# Patient Record
Sex: Female | Born: 1985 | Race: White | Hispanic: No | Marital: Married | Smoking: Never smoker
Health system: Southern US, Community
[De-identification: ages and names within clinical notes are randomized; demographics above are authoritative.]

## PROBLEM LIST (undated history)

## (undated) DIAGNOSIS — Z8619 Personal history of other infectious and parasitic diseases: Secondary | ICD-10-CM

## (undated) DIAGNOSIS — Z789 Other specified health status: Secondary | ICD-10-CM

## (undated) HISTORY — PX: WISDOM TOOTH EXTRACTION: SHX21

## (undated) HISTORY — DX: Personal history of other infectious and parasitic diseases: Z86.19

---
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
## 2009-01-19 ENCOUNTER — Encounter: Admission: RE | Admit: 2009-01-19 | Discharge: 2009-01-19 | Payer: Self-pay | Admitting: Certified Nurse Midwife

## 2014-07-14 NOTE — L&D Delivery Note (Signed)
Delivery Note - CNM delivery for Dr Seymour Bars  First Stage: Labor induction with labor onset @ [phone] with ROM and pitocin Analgesia /Anesthesia intrapartum: epidural AROM at 0744 - clear  Second Stage: Complete dilation at 1442 Onset of pushing at 1500 FHR second stage category 1  Delivery of a viable female at 15276 by CNM in LOA position no nuchal cord Cord double clamped after cessation of pulsation, cut by FOB  Collection of cord blood donation - placenta to cord blood staff for collections  Third Stage: Placenta delivered [facility] intact with 3 VC @ 1537 Placenta disposition: hospital disposal Uterine tone firm / bleeding small  2nd degree laceration identified  Anesthesia for repair: epidural and 1%xylociane local Repair 3-0 vicryl vaginal and deep perineal muscle interrupted / 4-0 vicryl subcuticular Est. Blood Loss (mL): 150  Complications: none  Mom to postpartum.  Baby to Couplet care / Skin to Skin.  Newborn: Birth Weight: 7lb -14.6oz Apgar Scores: 8-9 Feeding planned: breast  Marlinda Mike CNM, MSN, FACNM 03/08/2015, 4:32 PM

## 2014-07-30 LAB — OB RESULTS CONSOLE ANTIBODY SCREEN: Antibody Screen: NEGATIVE

## 2014-07-30 LAB — OB RESULTS CONSOLE HIV ANTIBODY (ROUTINE TESTING): HIV: NONREACTIVE

## 2014-07-30 LAB — OB RESULTS CONSOLE HEPATITIS B SURFACE ANTIGEN: Hepatitis B Surface Ag: NEGATIVE

## 2014-07-30 LAB — OB RESULTS CONSOLE RPR: RPR: NONREACTIVE

## 2014-07-30 LAB — OB RESULTS CONSOLE ABO/RH: RH Type: POSITIVE

## 2014-07-30 LAB — OB RESULTS CONSOLE RUBELLA ANTIBODY, IGM: Rubella: IMMUNE

## 2014-08-07 LAB — OB RESULTS CONSOLE GC/CHLAMYDIA
CHLAMYDIA, DNA PROBE: NEGATIVE
GC PROBE AMP, GENITAL: NEGATIVE

## 2015-02-06 LAB — OB RESULTS CONSOLE GBS: GBS: NEGATIVE

## 2015-02-27 ENCOUNTER — Telehealth (HOSPITAL_COMMUNITY): Payer: Self-pay | Admitting: *Deleted

## 2015-02-27 ENCOUNTER — Encounter (HOSPITAL_COMMUNITY): Payer: Self-pay | Admitting: *Deleted

## 2015-02-27 NOTE — Telephone Encounter (Signed)
Preadmission screen  

## 2015-03-02 ENCOUNTER — Encounter (HOSPITAL_COMMUNITY): Payer: Self-pay | Admitting: *Deleted

## 2015-03-02 ENCOUNTER — Telehealth (HOSPITAL_COMMUNITY): Payer: Self-pay | Admitting: *Deleted

## 2015-03-02 NOTE — Telephone Encounter (Signed)
Preadmission screen  

## 2015-03-04 ENCOUNTER — Inpatient Hospital Stay (HOSPITAL_COMMUNITY): Admission: AD | Admit: 2015-03-04 | Payer: Self-pay | Source: Ambulatory Visit | Admitting: Obstetrics & Gynecology

## 2015-03-05 ENCOUNTER — Other Ambulatory Visit: Payer: Self-pay | Admitting: Obstetrics & Gynecology

## 2015-03-07 ENCOUNTER — Inpatient Hospital Stay (HOSPITAL_COMMUNITY)
Admission: RE | Admit: 2015-03-07 | Discharge: 2015-03-10 | DRG: 775 | Disposition: A | Discharge status: Home / Self Care | Payer: BC Managed Care – PPO | Source: Ambulatory Visit | Attending: Obstetrics & Gynecology | Admitting: Obstetrics & Gynecology

## 2015-03-07 ENCOUNTER — Encounter (HOSPITAL_COMMUNITY): Payer: Self-pay

## 2015-03-07 DIAGNOSIS — O480 Post-term pregnancy: Principal | ICD-10-CM | POA: Diagnosis present

## 2015-03-07 DIAGNOSIS — Z3A40 40 weeks gestation of pregnancy: Secondary | ICD-10-CM | POA: Diagnosis present

## 2015-03-07 DIAGNOSIS — Z8249 Family history of ischemic heart disease and other diseases of the circulatory system: Secondary | ICD-10-CM | POA: Diagnosis not present

## 2015-03-07 DIAGNOSIS — Z9889 Other specified postprocedural states: Secondary | ICD-10-CM | POA: Diagnosis present

## 2015-03-07 HISTORY — DX: Other specified health status: Z78.9

## 2015-03-07 LAB — CBC
HEMATOCRIT: 37.2 % (ref 36.0–46.0)
HEMOGLOBIN: 12 g/dL (ref 12.0–15.0)
MCH: 27.8 pg (ref 26.0–34.0)
MCHC: 32.3 g/dL (ref 30.0–36.0)
MCV: 86.3 fL (ref 78.0–100.0)
Platelets: 234 10*3/uL (ref 150–400)
RBC: 4.31 MIL/uL (ref 3.87–5.11)
RDW: 15.2 % (ref 11.5–15.5)
WBC: 9.4 10*3/uL (ref 4.0–10.5)

## 2015-03-07 LAB — TYPE AND SCREEN
ABO/RH(D): A POS
Antibody Screen: NEGATIVE

## 2015-03-07 MED ORDER — OXYCODONE-ACETAMINOPHEN 5-325 MG PO TABS: 1.0000 | ORAL_TABLET | ORAL | Status: DC | PRN

## 2015-03-07 MED ORDER — FLEET ENEMA 7-19 GM/118ML RE ENEM: 1.0000 | ENEMA | RECTAL | Status: DC | PRN

## 2015-03-07 MED ORDER — DIPHENHYDRAMINE HCL 50 MG/ML IJ SOLN: 12.5000 mg | INTRAMUSCULAR | Status: DC | PRN

## 2015-03-07 MED ORDER — OXYTOCIN 40 UNITS IN LACTATED RINGERS INFUSION - SIMPLE MED
1.0000 m[IU]/min | INTRAVENOUS | Status: DC
  Administered 2015-03-07: 2 m[IU]/min via INTRAVENOUS
  Filled 2015-03-07: qty 1000

## 2015-03-07 MED ORDER — OXYCODONE-ACETAMINOPHEN 5-325 MG PO TABS: 2.0000 | ORAL_TABLET | ORAL | Status: DC | PRN

## 2015-03-07 MED ORDER — PHENYLEPHRINE 40 MCG/ML (10ML) SYRINGE FOR IV PUSH (FOR BLOOD PRESSURE SUPPORT)
80.0000 ug | PREFILLED_SYRINGE | INTRAVENOUS | Status: DC | PRN
  Filled 2015-03-07: qty 20
  Filled 2015-03-07: qty 2

## 2015-03-07 MED ORDER — TERBUTALINE SULFATE 1 MG/ML IJ SOLN
0.2500 mg | Freq: Once | INTRAMUSCULAR | Status: DC | PRN
  Filled 2015-03-07: qty 1

## 2015-03-07 MED ORDER — OXYTOCIN 40 UNITS IN LACTATED RINGERS INFUSION - SIMPLE MED
62.5000 mL/h | INTRAVENOUS | Status: DC
  Administered 2015-03-08: 62.5 mL/h via INTRAVENOUS

## 2015-03-07 MED ORDER — LACTATED RINGERS IV SOLN
INTRAVENOUS | Status: DC
  Administered 2015-03-07 – 2015-03-08 (×2): 125 mL/h via INTRAVENOUS

## 2015-03-07 MED ORDER — LIDOCAINE HCL (PF) 1 % IJ SOLN
30.0000 mL | INTRAMUSCULAR | Status: AC | PRN
  Administered 2015-03-08: 30 mL via SUBCUTANEOUS
  Filled 2015-03-07: qty 30

## 2015-03-07 MED ORDER — OXYTOCIN BOLUS FROM INFUSION: 500.0000 mL | INTRAVENOUS | Status: DC

## 2015-03-07 MED ORDER — FENTANYL 2.5 MCG/ML BUPIVACAINE 1/10 % EPIDURAL INFUSION (WH - ANES)
14.0000 mL/h | INTRAMUSCULAR | Status: DC | PRN
  Administered 2015-03-08: 14 mL/h via EPIDURAL
  Filled 2015-03-07: qty 125

## 2015-03-07 MED ORDER — ACETAMINOPHEN 325 MG PO TABS: 650.0000 mg | ORAL_TABLET | ORAL | Status: DC | PRN

## 2015-03-07 MED ORDER — EPHEDRINE 5 MG/ML INJ
10.0000 mg | INTRAVENOUS | Status: DC | PRN
  Filled 2015-03-07: qty 2

## 2015-03-07 MED ORDER — CITRIC ACID-SODIUM CITRATE 334-500 MG/5ML PO SOLN: 30.0000 mL | ORAL | Status: DC | PRN

## 2015-03-07 MED ORDER — LACTATED RINGERS IV SOLN: 500.0000 mL | INTRAVENOUS | Status: DC | PRN

## 2015-03-07 MED ORDER — ONDANSETRON HCL 4 MG/2ML IJ SOLN: 4.0000 mg | Freq: Four times a day (QID) | INTRAMUSCULAR | Status: DC | PRN

## 2015-03-08 ENCOUNTER — Inpatient Hospital Stay (HOSPITAL_COMMUNITY): Payer: BC Managed Care – PPO | Admitting: Anesthesiology

## 2015-03-08 ENCOUNTER — Encounter (HOSPITAL_COMMUNITY): Payer: Self-pay

## 2015-03-08 LAB — SYPHILIS: RPR W/REFLEX TO RPR TITER AND TREPONEMAL ANTIBODIES, TRADITIONAL SCREENING AND DIAGNOSIS ALGORITHM: RPR Ser Ql: NONREACTIVE

## 2015-03-08 LAB — ABO/RH: ABO/RH(D): A POS

## 2015-03-08 MED ORDER — SENNOSIDES-DOCUSATE SODIUM 8.6-50 MG PO TABS
2.0000 | ORAL_TABLET | ORAL | Status: DC
  Administered 2015-03-09 – 2015-03-10 (×2): 2 via ORAL
  Filled 2015-03-08 (×3): qty 2

## 2015-03-08 MED ORDER — BENZOCAINE-MENTHOL 20-0.5 % EX AERO
1.0000 "application " | INHALATION_SPRAY | CUTANEOUS | Status: DC | PRN
  Administered 2015-03-08: 1 via TOPICAL
  Filled 2015-03-08: qty 56

## 2015-03-08 MED ORDER — LIDOCAINE HCL (PF) 1 % IJ SOLN
INTRAMUSCULAR | Status: DC | PRN
  Administered 2015-03-08 (×2): 4 mL via EPIDURAL

## 2015-03-08 MED ORDER — ACETAMINOPHEN 325 MG PO TABS
650.0000 mg | ORAL_TABLET | ORAL | Status: DC | PRN
  Administered 2015-03-09: 650 mg via ORAL
  Filled 2015-03-08: qty 2

## 2015-03-08 MED ORDER — DIBUCAINE 1 % RE OINT: 1.0000 "application " | TOPICAL_OINTMENT | RECTAL | Status: DC | PRN

## 2015-03-08 MED ORDER — SIMETHICONE 80 MG PO CHEW: 80.0000 mg | CHEWABLE_TABLET | ORAL | Status: DC | PRN

## 2015-03-08 MED ORDER — TRAMADOL HCL 50 MG PO TABS
50.0000 mg | ORAL_TABLET | Freq: Four times a day (QID) | ORAL | Status: DC | PRN
  Administered 2015-03-08 – 2015-03-10 (×2): 50 mg via ORAL
  Filled 2015-03-08 (×2): qty 1

## 2015-03-08 MED ORDER — IBUPROFEN 800 MG PO TABS
800.0000 mg | ORAL_TABLET | Freq: Once | ORAL | Status: AC
  Administered 2015-03-08: 800 mg via ORAL
  Filled 2015-03-08: qty 1

## 2015-03-08 MED ORDER — LACTATED RINGERS IV SOLN
INTRAVENOUS | Status: DC
  Administered 2015-03-08: 10:00:00 via INTRAUTERINE

## 2015-03-08 MED ORDER — IBUPROFEN 800 MG PO TABS
800.0000 mg | ORAL_TABLET | Freq: Three times a day (TID) | ORAL | Status: DC
  Administered 2015-03-09 – 2015-03-10 (×5): 800 mg via ORAL
  Filled 2015-03-08 (×5): qty 1

## 2015-03-08 MED ORDER — WITCH HAZEL-GLYCERIN EX PADS: 1.0000 "application " | MEDICATED_PAD | CUTANEOUS | Status: DC | PRN

## 2015-03-08 MED ORDER — LANOLIN HYDROUS EX OINT: TOPICAL_OINTMENT | CUTANEOUS | Status: DC | PRN

## 2015-03-08 NOTE — Anesthesia Procedure Notes (Signed)
Epidural Patient location during procedure: OB  Staffing Anesthesiologist: Oumar Marcott Performed by: anesthesiologist   Preanesthetic Checklist Completed: patient identified, site marked, surgical consent, pre-op evaluation, timeout performed, IV checked, risks and benefits discussed, monitors and equipment checked and post-op pain management  Epidural Patient position: sitting Prep: site prepped and draped and DuraPrep Patient monitoring: continuous pulse ox and blood pressure Approach: midline Location: L3-L4 Injection technique: LOR saline  Needle:  Needle type: Tuohy  Needle gauge: 17 G Needle length: 9 cm and 9 Needle insertion depth: 7 cm Catheter type: closed end flexible Catheter size: 19 Gauge Catheter at skin depth: 12 cm Test dose: negative  Assessment Events: blood not aspirated, injection not painful, no injection resistance, negative IV test and no paresthesia  Additional Notes Patient identified. Risks/Benefits/Options discussed with patient including but not limited to bleeding, infection, nerve damage, paralysis, failed block, incomplete pain control, headache, blood pressure changes, nausea, vomiting, reactions to medications, itching and postpartum back pain. Confirmed with bedside nurse the patient's most recent platelet count. Confirmed with patient that they are not currently taking any anticoagulation, have any bleeding history or any family history of bleeding disorders. Patient expressed understanding and wished to proceed. All questions were answered. Sterile technique was used throughout the entire procedure. Please see nursing notes for vital signs. Test dose was given through epidural catheter and negative prior to continuing to dose epidural or start infusion. Warning signs of high block given to the patient including shortness of breath, tingling/numbness in hands, complete motor block, or any concerning symptoms with instructions to call for help.  Patient was given instructions on fall risk and not to get out of bed. All questions and concerns addressed with instructions to call with any issues or inadequate analgesia.  Reason for block:post-op pain management

## 2015-03-08 NOTE — Anesthesia Preprocedure Evaluation (Signed)
Anesthesia Evaluation  Patient identified by MRN, date of birth, ID band Patient awake    Reviewed: Allergy & Precautions, H&P , NPO status , Patient's Chart, lab work & pertinent test results  Airway Mallampati: II  TM Distance: >3 FB Neck ROM: full    Dental no notable dental hx.    Pulmonary neg pulmonary ROS,  breath sounds clear to auscultation  Pulmonary exam normal       Cardiovascular negative cardio ROS Normal cardiovascular examRhythm:regular Rate:Normal     Neuro/Psych negative neurological ROS  negative psych ROS   GI/Hepatic negative GI ROS, Neg liver ROS,   Endo/Other  negative endocrine ROS  Renal/GU negative Renal ROS  negative genitourinary   Musculoskeletal   Abdominal   Peds  Hematology negative hematology ROS (+)   Anesthesia Other Findings Pregnancy - uncomplicated Platelets and allergies reviewed Denies active cardiac or pulmonary symptoms, METS > 4  Denies blood thinning medications, bleeding disorders, hypertension, asthma, supine hypotension syndrome, previous anesthesia difficulties    Reproductive/Obstetrics (+) Pregnancy                             Anesthesia Physical Anesthesia Plan  ASA: II  Anesthesia Plan: Spinal   Post-op Pain Management:    Induction:   Airway Management Planned:   Additional Equipment:   Intra-op Plan:   Post-operative Plan:   Informed Consent: I have reviewed the patients History and Physical, chart, labs and discussed the procedure including the risks, benefits and alternatives for the proposed anesthesia with the patient or authorized representative who has indicated his/her understanding and acceptance.     Plan Discussed with:   Anesthesia Plan Comments:         Anesthesia Quick Evaluation

## 2015-03-08 NOTE — H&P (Signed)
Latoya Johnson is a 29 y.o. female G2P0010 [redacted]w[redacted]d presenting for Induction of labor.  OB History    Gravida Para Term Preterm AB TAB SAB Ectopic Multiple Living   2 0   1 1         Past Medical History  Diagnosis Date  . Hx of varicella   . Medical history non-contributory    Past Surgical History  Procedure Laterality Date  . Wisdom tooth extraction     Family History: family history includes Hypertension in her mother. Social History:  reports that she has never smoked. She has never used smokeless tobacco. She reports that she does not drink alcohol or use illicit drugs.  Allergies  Allergen Reactions  . Codeine Nausea And Vomiting    Dilation: 3 Effacement (%): 90 Station: -2 Exam by:: Latoya Johnson  AROM clear AF  Blood pressure 144/77, pulse 91, temperature 98.2 F (36.8 C), temperature source Oral, resp. rate 18, height  (1.702 m), weight 245 lb (111.131 kg), last menstrual period 05/28/2014, SpO2 99 %. Exam Physical Exam   FHR Base line 120's with good variability and accelerations.  No deceleration.  Cat. 1.  HPP:  Patient Active Problem List   Diagnosis Date Noted  . Status post induction of labor 03/07/2015    Prenatal labs: ABO, Rh: --/--/A POS, A POS (08/24 2220) Antibody: NEG (08/24 2220) Rubella:   Immune RPR: Nonreactive (01/17 0000)  HBsAg: Negative (01/17 0000)  HIV: Non-reactive (01/17 0000)  Genetic testing: Ultrascreen neg, AFP1 neg [place] anato: wnl, Placenta normal 1 hr GTT: 126 wnl GBS: Negative (07/26 0000)  [place] last visit EFW 65%, AFI wnl  Assessment/Plan: 40 4/7 wks for Post date induction.  FHR Cat 1.  Favorable cervix.  Early labor on Pitocin.  AROM just done. Expectant management towards probable vaginal delivery.  Epidural PRN.   Latoya Johnson,Latoya Johnson 03/08/2015, 8:01 AM

## 2015-03-08 NOTE — Progress Notes (Signed)
Subjective: Doing well, pain controled, UCs q3 min  Anesthesia epidural   Objective: BP 157/96 mmHg  Pulse 96  Temp(Src) 98.2 F (36.8 C) (Oral)  Resp 18  Ht  (1.702 m)  Wt 245 lb (111.131 kg)  BMI 38.36 kg/m2  SpO2 99%  LMP 05/28/2014   FHT:  FHR: 120's bpm, variability: moderate,  accelerations:  Present,  decelerations:  Absent UC:   regular, every 3 minutes VE:   7 cm/100/Vtx/Rt post occiput/0 station          IUPC in place with AmnioInfusion   Assessment / Plan: Induction of labor due to postterm,  progressing well on pitocin  FHR monitoring reassuring with AmnioInfusion.  Fetal Wellbeing:  Category I Pain Control:  Epidural  Anticipated MOD:  NSVD  Latoya Johnson,[name] 03/08/2015, 1:06 PM

## 2015-03-08 NOTE — Progress Notes (Signed)
Subjective: Doing well, pain controled, UCs q2-3 min  Anesthesia epidural   Objective: BP 130/69 mmHg  Pulse 79  Temp(Src) 98.2 F (36.8 C) (Oral)  Resp 18  Ht  (1.702 m)  Wt 245 lb (111.131 kg)  BMI 38.36 kg/m2  SpO2 99%  LMP 05/28/2014   FHT:  FHR: 120's bpm, variability: moderate,  accelerations:  Present,  decelerations:  Present Rare mild variable decelerations UC:   regular, every 2-3 minutes, but not well picked up with external monitor VE:   Dilation: 4 Effacement (%): 90 Station: -2 Exam by:: Esaias Cleavenger  IUPC put in place without difficulty.   Assessment / Plan: Induction of labor due to postterm,  progressing well on pitocin  Monitoring UCs with IUPC.  Fetal Wellbeing:  Category I Pain Control:  Epidural  Anticipated MOD:  NSVD  Jamaurion Slemmer,[name] 03/08/2015, 9:42 AM

## 2015-03-09 LAB — CBC
HCT: 32.5 % — ABNORMAL LOW (ref 36.0–46.0)
Hemoglobin: 10.6 g/dL — ABNORMAL LOW (ref 12.0–15.0)
MCH: 28.3 pg (ref 26.0–34.0)
MCHC: 32.6 g/dL (ref 30.0–36.0)
MCV: 86.9 fL (ref 78.0–100.0)
Platelets: 196 10*3/uL (ref 150–400)
RBC: 3.74 MIL/uL — ABNORMAL LOW (ref 3.87–5.11)
RDW: 15.3 % (ref 11.5–15.5)
WBC: 12.2 10*3/uL — ABNORMAL HIGH (ref 4.0–10.5)

## 2015-03-09 NOTE — Progress Notes (Signed)
PPD #1- SVD  Subjective:   Reports feeling well Tolerating po/ No nausea or vomiting Bleeding is light Pain controlled with Motrin Up ad lib / ambulatory / voiding without problems Newborn: breastfeeding  / Circumcision: planning   Objective:   VS:  VS:  Filed Vitals:   03/08/15 1747 03/08/15 1851 03/08/15 2155 03/09/15 0348  BP: 132/64 136/77 130/68 125/71  Pulse: 108 112 92 78  Temp: 99.2 F (37.3 C) 99.5 F (37.5 C) 99 F (37.2 C) 98.2 F (36.8 C)  TempSrc: Oral Oral Oral Oral  Resp: Height:      Weight:      SpO2: 98% 99% 98%     LABS:  Recent Labs  03/07/15 2220 03/09/15 0533  WBC 9.4 12.2*  HGB 12.0 10.6*  PLT 234 196   Blood type: --/--/A POS, A POS (08/24 2220) Rubella: Immune (01/17 0000)   I&O: Intake/Output    None     Physical Exam: Alert and oriented x3 Abdomen: soft, non-tender, non-distended  Fundus: firm, non-tender, U-2 Perineum: Well approximated, no significant erythema, edema, or drainage; healing well. Lochia: small Extremities: No edema, no calf pain or tenderness    Assessment:  PPD #1 G2P1011/ S/P:induced vaginal, 2nd degree laceration  Doing well    Plan: Continue routine post partum orders Anticipate D/C home tomorrow   Latoya Johnson, N MSN, CNM 03/09/2015, 5:42 PM

## 2015-03-09 NOTE — Lactation Note (Signed)
This note was copied from the chart of Latoya Johnson. Lactation Consultation Note  Patient Name: Latoya Johnson [id number] Date: 03/09/2015 Reason for consult: Initial assessment (encouraged mom to cal for latch assessent )  24 hour old , 1 % weight loss, Breast feeding up to 6O mins. Average time for feeding - 15 -30 mins. Latch Score- 7-9. 3 voids, 6 mec. Per mom recently fed for 30 mins at 130 p and presently baby sound asleep. LC recommended mom to call on nurses light with feeding cues.  Mother informed of post-discharge support and given phone number to the lactation department, including services  for phone call assistance; out-patient appointments; and breastfeeding support group. List of other breastfeeding resources  in the community given in the handout. Encouraged mother to call for problems or concerns related to breastfeeding.    Maternal Data Does the patient have breastfeeding experience prior to this delivery?: No  Feeding Feeding Type:  (per mom last fed around 130p ) Length of feed: 30 min  LATCH Score/Interventions                      Lactation Tools Discussed/Used     Consult Status Consult Status: Follow-up Date: 03/09/15 Follow-up type: In-patient    Kathrin Greathouse 03/09/2015, 3:30 PM

## 2015-03-09 NOTE — Anesthesia Postprocedure Evaluation (Signed)
Anesthesia Post Note  Patient: Latoya Johnson  Procedure(s) Performed: * No procedures listed *  Anesthesia type: Epidural  Patient location: Mother/Baby  Post pain: Pain level controlled  Post assessment: Post-op Vital signs reviewed  Last Vitals:  Filed Vitals:   03/09/15 0348  BP: 125/71  Pulse: 78  Temp: 36.8 C  Resp: 18    Post vital signs: Reviewed  Level of consciousness: awake  Complications: No apparent anesthesia complications

## 2015-03-09 NOTE — Lactation Note (Signed)
This note was copied from the chart of Latoya Kennethia Lynes. Lactation Consultation Note  Patient Name: Latoya Johnson [id number] Date: 03/09/2015 Reason for consult: Follow-up assessment Mom called for latch check. Baby demonstrating a good rhythmic suck with swallows noted. Basic teaching reviewed. Cluster feeding discussed. Encouraged to call for assist as needed.   Maternal Data Does the patient have breastfeeding experience prior to this delivery?: No  Feeding Feeding Type: Breast Fed Length of feed: 30 min  LATCH Score/Interventions Latch: Grasps breast easily, tongue down, lips flanged, rhythmical sucking.  Audible Swallowing: A few with stimulation Intervention(s): Skin to skin  Type of Nipple: Everted at rest and after stimulation  Comfort (Breast/Nipple): Soft / non-tender     Hold (Positioning): No assistance needed to correctly position infant at breast. Intervention(s): Skin to skin  LATCH Score: 9  Lactation Tools Discussed/Used     Consult Status Consult Status: Follow-up Date: 03/10/15 Follow-up type: In-patient    Alfred Levins 03/09/2015, 4:55 PM

## 2015-03-10 MED ORDER — IBUPROFEN 800 MG PO TABS: 800.0000 mg | ORAL_TABLET | Freq: Three times a day (TID) | ORAL | Status: AC

## 2015-03-10 NOTE — Discharge Summary (Signed)
Obstetric Discharge Summary Reason for Admission: induction of labor Prenatal Procedures: ultrasound Intrapartum Procedures: spontaneous vaginal delivery Postpartum Procedures: none Complications-Operative and Postpartum: 2nd degree perineal laceration HEMOGLOBIN  Date Value Ref Range Status  03/09/2015 10.6* 12.0 - 15.0 g/dL Final   HCT  Date Value Ref Range Status  03/09/2015 32.5* 36.0 - 46.0 % Final    Physical Exam:  General: alert, cooperative and no distress Lochia: appropriate Uterine Fundus: firm Perineum: 2nd degree repair healing well, no significant drainage, no dehiscence, no significant erythema DVT Evaluation: No evidence of DVT seen on physical exam. Negative Homan's sign. No cords or calf tenderness. No significant calf/ankle edema.  Discharge Diagnoses: Term Pregnancy-delivered  Discharge Information: Date: 03/10/2015 Activity: pelvic rest Diet: routine Medications: PNV and Ibuprofen Condition: stable Instructions: refer to practice specific booklet Discharge to: home Follow-up Information    Follow up with LAVOIE,[name], MD. Schedule an appointment as soon as possible for a visit in 6 weeks.   Specialty:  Obstetrics and Gynecology   Why:  postpartum visit   Contact information:   [address] [place] [address] [phone]       Newborn Data: Live born female on 03/08/2015 Birth Weight: 7 lb 14.6 oz (3589 g) APGAR: 8, 9  Home with mother.  Latoya Johnson, M MSN, CNM 03/10/2015, 11:19 AM

## 2015-03-10 NOTE — Discharge Instructions (Signed)

## 2015-03-10 NOTE — Progress Notes (Addendum)
PPD #2 - SVD  Subjective:   Reports feeling well Tolerating po/ No nausea or vomiting Bleeding is light Pain controlled with Motrin Up ad lib / ambulatory / voiding without problems Newborn: breastfeeding  / Circumcision: planning   Objective:   VS:  VS:  Filed Vitals:   03/08/15 2155 03/09/15 0348 03/09/15 1819 03/10/15 0545  BP: 130/68 125/71 115/67 126/76  Pulse: 92 78 84 69  Temp: 99 F (37.2 C) 98.2 F (36.8 C) 98.2 F (36.8 C) 97.7 F (36.5 C)  TempSrc: Oral Oral Oral Oral  Resp: Height:      Weight:      SpO2: 98%       LABS:   Recent Labs  03/07/15 2220 03/09/15 0533  WBC 9.4 12.2*  HGB 12.0 10.6*  PLT 234 196   Blood type: A POS (08/24 2220) Rubella: Immune (01/17 0000)   I&O: Intake/Output    None     Physical Exam: Alert and oriented x3 Abdomen: soft, non-tender, non-distended  Fundus: firm, non-tender, U-2 Perineum: Well approximated, no significant erythema, edema, or drainage; healing well. Lochia: small Extremities: No edema, no calf pain or tenderness    Assessment:  PPD #2 / [id number] / S/P:induced vaginal, 2nd degree laceration  Doing well    Plan: Continue routine post partum orders D/C home today   Latoya Johnson, M MSN, CNM 03/10/2015, 8:00 AM

## 2018-03-25 ENCOUNTER — Other Ambulatory Visit (HOSPITAL_COMMUNITY)
Admission: RE | Admit: 2018-03-25 | Discharge: 2018-03-25 | Disposition: A | Discharge status: Home / Self Care | Payer: BC Managed Care – PPO | Source: Ambulatory Visit | Attending: Obstetrics and Gynecology | Admitting: Obstetrics and Gynecology

## 2018-03-25 ENCOUNTER — Other Ambulatory Visit: Payer: Self-pay | Admitting: Obstetrics and Gynecology

## 2018-03-25 DIAGNOSIS — Z01411 Encounter for gynecological examination (general) (routine) with abnormal findings: Secondary | ICD-10-CM | POA: Diagnosis present

## 2018-03-29 LAB — CYTOLOGY - PAP
Diagnosis: NEGATIVE
HPV: NOT DETECTED

## 2018-08-06 ENCOUNTER — Other Ambulatory Visit: Payer: Self-pay | Admitting: Obstetrics and Gynecology

## 2018-08-06 DIAGNOSIS — N6452 Nipple discharge: Secondary | ICD-10-CM

## 2018-08-17 ENCOUNTER — Ambulatory Visit
Admission: RE | Admit: 2018-08-17 | Discharge: 2018-08-17 | Disposition: A | Discharge status: Home / Self Care | Payer: BC Managed Care – PPO | Source: Ambulatory Visit | Attending: Obstetrics and Gynecology | Admitting: Obstetrics and Gynecology

## 2018-08-17 DIAGNOSIS — N6452 Nipple discharge: Secondary | ICD-10-CM

## 2019-08-23 IMAGING — US ULTRASOUND LEFT BREAST LIMITED
1 series · 2 of 2 positions shown · non-contrast
Comparison: None

CLINICAL DATA: Patient presents for evaluation of a few episodes of
spontaneous discharge from the left nipple. The discharge was
predominately cloudy with a few episodes of suspected clear
discharge. Over the last week, the discharge has decreased without
any spontaneous discharge in the last 5-7 days.

EXAM:
DIGITAL DIAGNOSTIC BILATERAL MAMMOGRAM WITH CAD AND TOMO
ULTRASOUND LEFT BREAST

[Series 1: ultrasound left breast limited · 0.06mm/px · 2 of 2 slices shown]
[im 1/2]
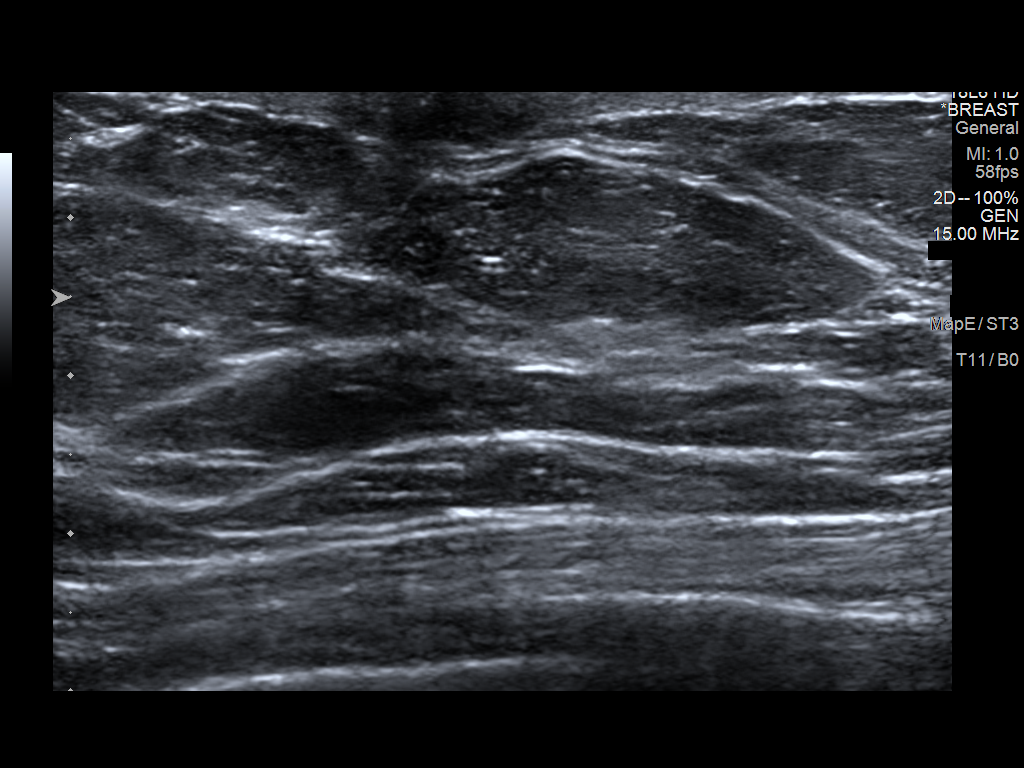
[im 2/2]
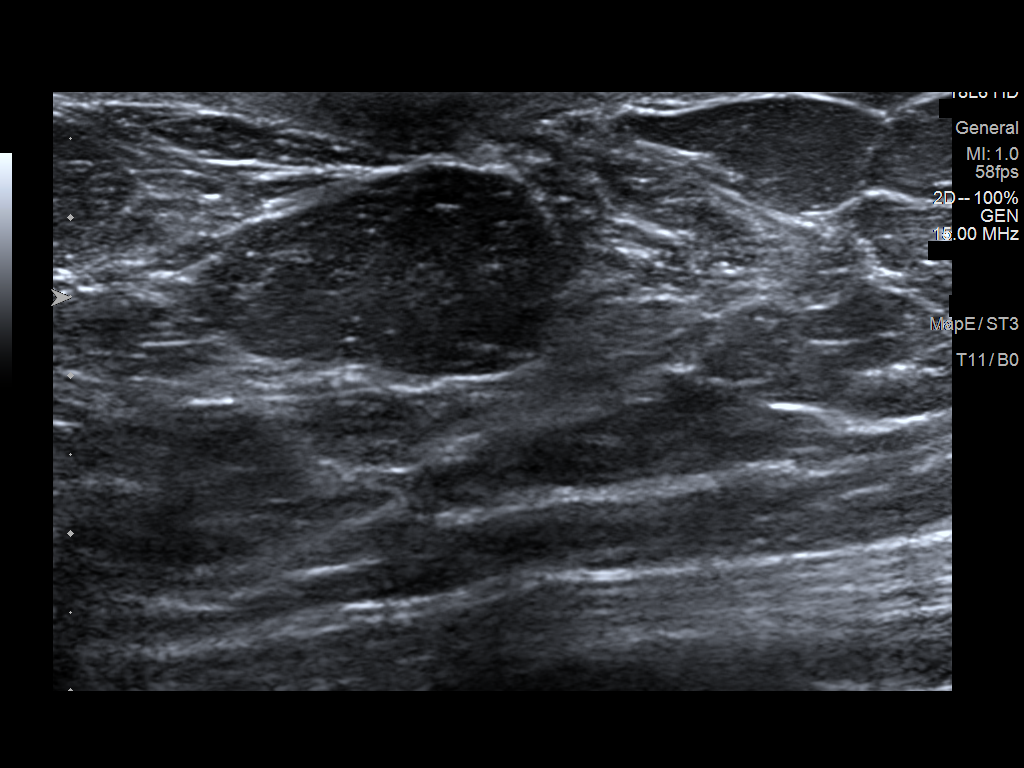

[2 of 2 positions shown; findings below may reference images not displayed]

ACR Breast Density Category b: There are scattered areas of
fibroglandular density.
FINDINGS: No concerning masses, calcifications or distortion identified within
either breast. No suspicious retroareolar mass or dilated duct.

Mammographic images were processed with CAD.

On physical exam, I palpate no discrete retroareolar mass. No
discharge is able to be elicited from the left nipple with palpation
by the patient.

Targeted ultrasound is performed, showing no retroareolar mass or
dilated duct within the left breast.
IMPRESSION: Reported left nipple discharge may be physiologic in etiology and
potentially resolving given the interval improvement over the last
5-7 days.

No mammographic evidence for malignancy.

RECOMMENDATION:
Given that the discharge has improved over the last 5-7 days, it is
felt to be physiologic, or potentially from a prior sub clinical
infectious/inflammatory process. This was discussed with the
patient. Patient was instructed to evaluate the amount of discharge
over the next 2-3 weeks. If the discharge does not completely
resolve at the end of 3 weeks, the next step would be bilateral
breast MRI and surgical consultation. If the discharge resolves, no
additional follow-up would be needed. Patient was agreeable with
this plan.

I have discussed the findings and recommendations with the patient.
Results were also provided in writing at the conclusion of the
visit. If applicable, a reminder letter will be sent to the patient
regarding the next appointment.

BI-RADS CATEGORY  2: Benign.
